# Patient Record
Sex: Female | Born: 1970 | Race: White | Hispanic: Yes | Marital: Single | Smoking: Never smoker
Health system: Southern US, Community
[De-identification: ages and names within clinical notes are randomized; demographics above are authoritative.]

## PROBLEM LIST (undated history)

## (undated) DIAGNOSIS — R87619 Unspecified abnormal cytological findings in specimens from cervix uteri: Secondary | ICD-10-CM

## (undated) HISTORY — DX: Unspecified abnormal cytological findings in specimens from cervix uteri: R87.619

---

## 1999-01-05 ENCOUNTER — Encounter: Admission: RE | Admit: 1999-01-05 | Discharge: 1999-04-05 | Payer: Self-pay

## 1999-11-01 ENCOUNTER — Other Ambulatory Visit: Admission: RE | Admit: 1999-11-01 | Discharge: 1999-11-01 | Payer: Self-pay | Admitting: Family Medicine

## 2000-05-02 ENCOUNTER — Inpatient Hospital Stay (HOSPITAL_COMMUNITY): Admission: AD | Admit: 2000-05-02 | Discharge: 2000-05-02 | Payer: Self-pay | Admitting: Family Medicine

## 2000-05-03 ENCOUNTER — Encounter: Payer: Self-pay | Admitting: Family Medicine

## 2000-05-23 ENCOUNTER — Encounter: Payer: Self-pay | Admitting: Family Medicine

## 2000-05-23 ENCOUNTER — Ambulatory Visit (HOSPITAL_COMMUNITY): Admission: RE | Admit: 2000-05-23 | Discharge: 2000-05-23 | Payer: Self-pay | Admitting: Family Medicine

## 2000-06-12 ENCOUNTER — Other Ambulatory Visit: Admission: RE | Admit: 2000-06-12 | Discharge: 2000-06-12 | Payer: Self-pay | Admitting: Family Medicine

## 2000-07-23 ENCOUNTER — Ambulatory Visit (HOSPITAL_COMMUNITY): Admission: RE | Admit: 2000-07-23 | Discharge: 2000-07-23 | Payer: Self-pay | Admitting: Family Medicine

## 2000-07-23 ENCOUNTER — Encounter: Payer: Self-pay | Admitting: Family Medicine

## 2000-09-28 ENCOUNTER — Encounter: Payer: Self-pay | Admitting: Family Medicine

## 2000-09-28 ENCOUNTER — Ambulatory Visit (HOSPITAL_COMMUNITY): Admission: RE | Admit: 2000-09-28 | Discharge: 2000-09-28 | Payer: Self-pay | Admitting: Family Medicine

## 2000-11-27 HISTORY — PX: CHOLECYSTECTOMY: SHX55

## 2000-12-03 ENCOUNTER — Ambulatory Visit (HOSPITAL_COMMUNITY): Admission: RE | Admit: 2000-12-03 | Discharge: 2000-12-03 | Payer: Self-pay | Admitting: *Deleted

## 2000-12-03 ENCOUNTER — Encounter: Payer: Self-pay | Admitting: Family Medicine

## 2000-12-10 ENCOUNTER — Inpatient Hospital Stay (HOSPITAL_COMMUNITY): Admission: AD | Admit: 2000-12-10 | Discharge: 2000-12-12 | Payer: Self-pay | Admitting: Family Medicine

## 2001-02-01 ENCOUNTER — Encounter (HOSPITAL_BASED_OUTPATIENT_CLINIC_OR_DEPARTMENT_OTHER): Payer: Self-pay | Admitting: General Surgery

## 2001-02-01 ENCOUNTER — Ambulatory Visit (HOSPITAL_COMMUNITY): Admission: RE | Admit: 2001-02-01 | Discharge: 2001-02-02 | Payer: Self-pay | Admitting: General Surgery

## 2005-03-24 ENCOUNTER — Ambulatory Visit: Payer: Self-pay | Admitting: Family Medicine

## 2006-01-22 ENCOUNTER — Ambulatory Visit: Payer: Self-pay | Admitting: Family Medicine

## 2006-02-26 ENCOUNTER — Ambulatory Visit: Payer: Self-pay | Admitting: Family Medicine

## 2006-12-21 ENCOUNTER — Ambulatory Visit: Payer: Self-pay | Admitting: Family Medicine

## 2009-11-27 DIAGNOSIS — R87619 Unspecified abnormal cytological findings in specimens from cervix uteri: Secondary | ICD-10-CM

## 2009-11-27 HISTORY — DX: Unspecified abnormal cytological findings in specimens from cervix uteri: R87.619

## 2009-11-27 HISTORY — PX: COLPOSCOPY W/ BIOPSY / CURETTAGE: SUR283

## 2012-02-13 ENCOUNTER — Other Ambulatory Visit (HOSPITAL_COMMUNITY)
Admission: RE | Admit: 2012-02-13 | Discharge: 2012-02-13 | Disposition: A | Discharge status: Home / Self Care | Payer: Self-pay | Source: Ambulatory Visit | Attending: Unknown Physician Specialty | Admitting: Unknown Physician Specialty

## 2012-02-13 DIAGNOSIS — R87619 Unspecified abnormal cytological findings in specimens from cervix uteri: Secondary | ICD-10-CM | POA: Insufficient documentation

## 2012-02-13 DIAGNOSIS — R87612 Low grade squamous intraepithelial lesion on cytologic smear of cervix (LGSIL): Secondary | ICD-10-CM | POA: Insufficient documentation

## 2013-09-22 ENCOUNTER — Other Ambulatory Visit (HOSPITAL_COMMUNITY): Payer: Self-pay | Admitting: Nurse Practitioner

## 2013-09-22 ENCOUNTER — Other Ambulatory Visit (HOSPITAL_COMMUNITY): Payer: Self-pay | Admitting: *Deleted

## 2013-09-22 DIAGNOSIS — Z1231 Encounter for screening mammogram for malignant neoplasm of breast: Secondary | ICD-10-CM

## 2013-10-02 ENCOUNTER — Ambulatory Visit (HOSPITAL_COMMUNITY)
Admission: RE | Admit: 2013-10-02 | Discharge: 2013-10-02 | Disposition: A | Discharge status: Home / Self Care | Payer: Managed Care, Other (non HMO) | Source: Ambulatory Visit | Attending: *Deleted | Admitting: *Deleted

## 2013-10-02 DIAGNOSIS — Z1231 Encounter for screening mammogram for malignant neoplasm of breast: Secondary | ICD-10-CM

## 2014-09-16 ENCOUNTER — Other Ambulatory Visit (HOSPITAL_COMMUNITY): Payer: Self-pay | Admitting: *Deleted

## 2014-09-16 DIAGNOSIS — Z1231 Encounter for screening mammogram for malignant neoplasm of breast: Secondary | ICD-10-CM

## 2014-10-05 ENCOUNTER — Ambulatory Visit (HOSPITAL_COMMUNITY)
Admission: RE | Admit: 2014-10-05 | Discharge: 2014-10-05 | Disposition: A | Discharge status: Home / Self Care | Payer: Managed Care, Other (non HMO) | Source: Ambulatory Visit | Attending: *Deleted | Admitting: *Deleted

## 2014-10-05 DIAGNOSIS — Z1231 Encounter for screening mammogram for malignant neoplasm of breast: Secondary | ICD-10-CM

## 2014-11-23 ENCOUNTER — Ambulatory Visit (INDEPENDENT_AMBULATORY_CARE_PROVIDER_SITE_OTHER): Payer: Managed Care, Other (non HMO) | Admitting: Obstetrics & Gynecology

## 2014-11-23 ENCOUNTER — Encounter: Payer: Self-pay | Admitting: Obstetrics & Gynecology

## 2014-11-23 VITALS — BP 93/64 | HR 67 | Resp 16 | Ht 61.0 in | Wt 142.0 lb

## 2014-11-23 DIAGNOSIS — Z30011 Encounter for initial prescription of contraceptive pills: Secondary | ICD-10-CM

## 2014-11-23 DIAGNOSIS — Z3009 Encounter for other general counseling and advice on contraception: Secondary | ICD-10-CM

## 2014-11-23 MED ORDER — NORELGESTROMIN-ETH ESTRADIOL 150-35 MCG/24HR TD PTWK: 1.0000 | MEDICATED_PATCH | TRANSDERMAL | Status: AC

## 2014-11-23 NOTE — Progress Notes (Signed)
Patient ID: Anna Henderson, female   DOB: [birth year], 43 y.o.   MRN: [id number]  Chief Complaint  Patient presents with  . Contraception    Last PAP this month - result normal    HPI Anna Henderson is a 43 y.o. female.  [id number] Patient's last menstrual period was 11/23/2014 (exact date). Was prescribed Tri Sprintec but considering alternative for ease of compliance. Was satisfied with [facility] years ago  HPI  Past Medical History  Diagnosis Date  . Abnormal Pap smear of cervix 2011    Past Surgical History  Procedure Laterality Date  . Colposcopy w/ biopsy / curettage  2011    Normal result  . Cesarean section  1994 and 2002  . Cholecystectomy  2002    History reviewed. No pertinent family history.  Social History History  Substance Use Topics  . Smoking status: Never Smoker   . Smokeless tobacco: Never Used  . Alcohol Use: No    Allergies  Allergen Reactions  . Penicillins Swelling    Current Outpatient Prescriptions  Medication Sig Dispense Refill  . Norgestimate-Ethinyl Estradiol Triphasic (TRI-SPRINTEC) 0.18/0.215/0.25 MG-35 MCG tablet Take 1 tablet by mouth daily.    . norelgestromin-ethinyl estradiol (ORTHO EVRA) 150-35 MCG/24HR transdermal patch Place 1 patch onto the skin once a week. 3 patch 12   No current facility-administered medications for this visit.    Review of Systems Review of Systems  Constitutional: Negative.   Respiratory: Negative.   Genitourinary: Positive for vaginal discharge. Negative for vaginal bleeding.    Blood pressure 93/64, pulse 67, resp. rate 16, height 5\' 1"  (1.549 m), weight 64.411 kg (142 lb), last menstrual period 11/23/2014.  Physical Exam Physical Exam  Constitutional: She is oriented to person, place, and time. She appears well-developed and well-nourished.  Neurological: She is alert and oriented to person, place, and time.  Psychiatric: She has a normal mood and affect. Her behavior is normal.    Data  Reviewed   Assessment    Birth control counseling     Plan    Rx [facility] given and instructions        Haasini Patnaude 11/23/2014, 10:16 AM

## 2014-11-23 NOTE — Patient Instructions (Signed)
Etonogestrel implant What is this medicine? ETONOGESTREL (et oh noe JES trel) is a contraceptive (birth control) device. It is used to prevent pregnancy. It can be used for up to 3 years. This medicine may be used for other purposes; ask your health care provider or pharmacist if you have questions. COMMON BRAND NAME(S): Implanon, Nexplanon What should I tell my health care provider before I take this medicine? They need to know if you have any of these conditions: -abnormal vaginal bleeding -blood vessel disease or blood clots -cancer of the breast, cervix, or liver -depression -diabetes -gallbladder disease -headaches -heart disease or recent heart attack -high blood pressure -high cholesterol -kidney disease -liver disease -renal disease -seizures -tobacco smoker -an unusual or allergic reaction to etonogestrel, other hormones, anesthetics or antiseptics, medicines, foods, dyes, or preservatives -pregnant or trying to get pregnant -breast-feeding How should I use this medicine? This device is inserted just under the skin on the inner side of your upper arm by a health care professional. Talk to your pediatrician regarding the use of this medicine in children. Special care may be needed. Overdosage: If you think you've taken too much of this medicine contact a poison control center or emergency room at once. Overdosage: If you think you have taken too much of this medicine contact a poison control center or emergency room at once. NOTE: This medicine is only for you. Do not share this medicine with others. What if I miss a dose? This does not apply. What may interact with this medicine? Do not take this medicine with any of the following medications: -amprenavir -bosentan -fosamprenavir This medicine may also interact with the following medications: -barbiturate medicines for inducing sleep or treating seizures -certain medicines for fungal infections like ketoconazole and  itraconazole -griseofulvin -medicines to treat seizures like carbamazepine, felbamate, oxcarbazepine, phenytoin, topiramate -modafinil -phenylbutazone -rifampin -some medicines to treat HIV infection like atazanavir, indinavir, lopinavir, nelfinavir, tipranavir, ritonavir -St. John's wort This list may not describe all possible interactions. Give your health care provider a list of all the medicines, herbs, non-prescription drugs, or dietary supplements you use. Also tell them if you smoke, drink alcohol, or use illegal drugs. Some items may interact with your medicine. What should I watch for while using this medicine? This product does not protect you against HIV infection (AIDS) or other sexually transmitted diseases. You should be able to feel the implant by pressing your fingertips over the skin where it was inserted. Tell your doctor if you cannot feel the implant. What side effects may I notice from receiving this medicine? Side effects that you should report to your doctor or health care professional as soon as possible: -allergic reactions like skin rash, itching or hives, swelling of the face, lips, or tongue -breast lumps -changes in vision -confusion, trouble speaking or understanding -dark urine -depressed mood -general ill feeling or flu-like symptoms -light-colored stools -loss of appetite, nausea -right upper belly pain -severe headaches -severe pain, swelling, or tenderness in the abdomen -shortness of breath, chest pain, swelling in a leg -signs of pregnancy -sudden numbness or weakness of the face, arm or leg -trouble walking, dizziness, loss of balance or coordination -unusual vaginal bleeding, discharge -unusually weak or tired -yellowing of the eyes or skin Side effects that usually do not require medical attention (Report these to your doctor or health care professional if they continue or are bothersome.): -acne -breast pain -changes in  weight -cough -fever or chills -headache -irregular menstrual bleeding -itching, burning, and   vaginal discharge -pain or difficulty passing urine -sore throat This list may not describe all possible side effects. Call your doctor for medical advice about side effects. You may report side effects to FDA at 1-800-FDA-1088. Where should I keep my medicine? This drug is given in a hospital or clinic and will not be stored at home. NOTE: This sheet is a summary. It may not cover all possible information. If you have questions about this medicine, talk to your doctor, pharmacist, or health care provider.  2015, Elsevier/Gold Standard. (2012-05-20 15:37:45) Intrauterine Device Information An intrauterine device (IUD) is inserted into your uterus to prevent pregnancy. There are two types of IUDs available:   Copper IUD--This type of IUD is wrapped in copper wire and is placed inside the uterus. Copper makes the uterus and fallopian tubes produce a fluid that kills sperm. The copper IUD can stay in place for 10 years.  Hormone IUD--This type of IUD contains the hormone progestin (synthetic progesterone). The hormone thickens the cervical mucus and prevents sperm from entering the uterus. It also thins the uterine lining to prevent implantation of a fertilized egg. The hormone can weaken or kill the sperm that get into the uterus. One type of hormone IUD can stay in place for 5 years, and another type can stay in place for 3 years. Your health care provider will make sure you are a good candidate for a contraceptive IUD. Discuss with your health care provider the possible side effects.  ADVANTAGES OF AN INTRAUTERINE DEVICE  IUDs are highly effective, reversible, long acting, and low maintenance.   There are no estrogen-related side effects.   An IUD can be used when breastfeeding.   IUDs are not associated with weight gain.   The copper IUD works immediately after insertion.   The hormone  IUD works right away if inserted within 7 days of your period starting. You will need to use a backup method of birth control for 7 days if the hormone IUD is inserted at any other time in your cycle.  The copper IUD does not interfere with your female hormones.   The hormone IUD can make heavy menstrual periods lighter and decrease cramping.   The hormone IUD can be used for 3 or 5 years.   The copper IUD can be used for 10 years. DISADVANTAGES OF AN INTRAUTERINE DEVICE  The hormone IUD can be associated with irregular bleeding patterns.   The copper IUD can make your menstrual flow heavier and more painful.   You may experience cramping and vaginal bleeding after insertion.  Document Released: 10/17/2004 Document Revised: 07/16/2013 Document Reviewed: 05/04/2013 [facility]Exit[facility] Patient Information 2015 [place], [place]. This information is not intended to replace advice given to you by your health care provider. Make sure you discuss any questions you have with your health care provider. Contraception Choices Contraception (birth control) is the use of any methods or devices to prevent pregnancy. Below are some methods to help avoid pregnancy. HORMONAL METHODS   Contraceptive implant. This is a thin, plastic tube containing progesterone hormone. It does not contain estrogen hormone. Your health care provider inserts the tube in the inner part of the upper arm. The tube can remain in place for up to 3 years. After 3 years, the implant must be removed. The implant prevents the ovaries from releasing an egg (ovulation), thickens the cervical mucus to prevent sperm from entering the uterus, and thins the lining of the inside of the uterus.  Progesterone-only injections.  These injections are given every 3 months by your health care provider to prevent pregnancy. This synthetic progesterone hormone stops the ovaries from releasing eggs. It also thickens cervical mucus and changes the uterine  lining. This makes it harder for sperm to survive in the uterus.  Birth control pills. These pills contain estrogen and progesterone hormone. They work by preventing the ovaries from releasing eggs (ovulation). They also cause the cervical mucus to thicken, preventing the sperm from entering the uterus. Birth control pills are prescribed by a health care provider.Birth control pills can also be used to treat heavy periods.  Minipill. This type of birth control pill contains only the progesterone hormone. They are taken every day of each month and must be prescribed by your health care provider.  Birth control patch. The patch contains hormones similar to those in birth control pills. It must be changed once a week and is prescribed by a health care provider.  Vaginal ring. The ring contains hormones similar to those in birth control pills. It is left in the vagina for 3 weeks, removed for 1 week, and then a new one is put back in place. The patient must be comfortable inserting and removing the ring from the vagina.A health care provider's prescription is necessary.  Emergency contraception. Emergency contraceptives prevent pregnancy after unprotected sexual intercourse. This pill can be taken right after sex or up to 5 days after unprotected sex. It is most effective the sooner you take the pills after having sexual intercourse. Most emergency contraceptive pills are available without a prescription. Check with your pharmacist. Do not use emergency contraception as your only form of birth control. BARRIER METHODS   Female condom. This is a thin sheath (latex or rubber) that is worn over the penis during sexual intercourse. It can be used with spermicide to increase effectiveness.  Female condom. This is a soft, loose-fitting sheath that is put into the vagina before sexual intercourse.  Diaphragm. This is a soft, latex, dome-shaped barrier that must be fitted by a health care provider. It is  inserted into the vagina, along with a spermicidal jelly. It is inserted before intercourse. The diaphragm should be left in the vagina for 6 to 8 hours after intercourse.  Cervical cap. This is a round, soft, latex or plastic cup that fits over the cervix and must be fitted by a health care provider. The cap can be left in place for up to 48 hours after intercourse.  Sponge. This is a soft, circular piece of polyurethane foam. The sponge has spermicide in it. It is inserted into the vagina after wetting it and before sexual intercourse.  Spermicides. These are chemicals that kill or block sperm from entering the cervix and uterus. They come in the form of creams, jellies, suppositories, foam, or tablets. They do not require a prescription. They are inserted into the vagina with an applicator before having sexual intercourse. The process must be repeated every time you have sexual intercourse. INTRAUTERINE CONTRACEPTION  Intrauterine device (IUD). This is a T-shaped device that is put in a woman's uterus during a menstrual period to prevent pregnancy. There are 2 types:  Copper IUD. This type of IUD is wrapped in copper wire and is placed inside the uterus. Copper makes the uterus and fallopian tubes produce a fluid that kills sperm. It can stay in place for 10 years.  Hormone IUD. This type of IUD contains the hormone progestin (synthetic progesterone). The hormone thickens the cervical mucus  and prevents sperm from entering the uterus, and it also thins the uterine lining to prevent implantation of a fertilized egg. The hormone can weaken or kill the sperm that get into the uterus. It can stay in place for 3-5 years, depending on which type of IUD is used. PERMANENT METHODS OF CONTRACEPTION  Female tubal ligation. This is when the woman's fallopian tubes are surgically sealed, tied, or blocked to prevent the egg from traveling to the uterus.  Hysteroscopic sterilization. This involves placing a  small coil or insert into each fallopian tube. Your doctor uses a technique called hysteroscopy to do the procedure. The device causes scar tissue to form. This results in permanent blockage of the fallopian tubes, so the sperm cannot fertilize the egg. It takes about 3 months after the procedure for the tubes to become blocked. You must use another form of birth control for these 3 months.  Female sterilization. This is when the female has the tubes that carry sperm tied off (vasectomy).This blocks sperm from entering the vagina during sexual intercourse. After the procedure, the man can still ejaculate fluid (semen). NATURAL PLANNING METHODS  Natural family planning. This is not having sexual intercourse or using a barrier method (condom, diaphragm, cervical cap) on days the woman could become pregnant.  Calendar method. This is keeping track of the length of each menstrual cycle and identifying when you are fertile.  Ovulation method. This is avoiding sexual intercourse during ovulation.  Symptothermal method. This is avoiding sexual intercourse during ovulation, using a thermometer and ovulation symptoms.  Post-ovulation method. This is timing sexual intercourse after you have ovulated. Regardless of which type or method of contraception you choose, it is important that you use condoms to protect against the transmission of sexually transmitted infections (STIs). Talk with your health care provider about which form of contraception is most appropriate for you. Document Released: 11/13/2005 Document Revised: 11/18/2013 Document Reviewed: 05/08/2013 [facility] Patient Information 2015 [place]ExitCare, [place]. This information is not intended to replace advice given to you by your health care provider. Make sure you discuss any questions you have with your health care provider.

## 2016-01-19 IMAGING — MG MS DIGITAL SCREENING BILAT
6 series · 6 of 6 positions shown · non-contrast
Comparison: Previous exam(s).

CLINICAL DATA: Screening.

EXAM:
DIGITAL SCREENING BILATERAL MAMMOGRAM WITH CAD

[R CC]
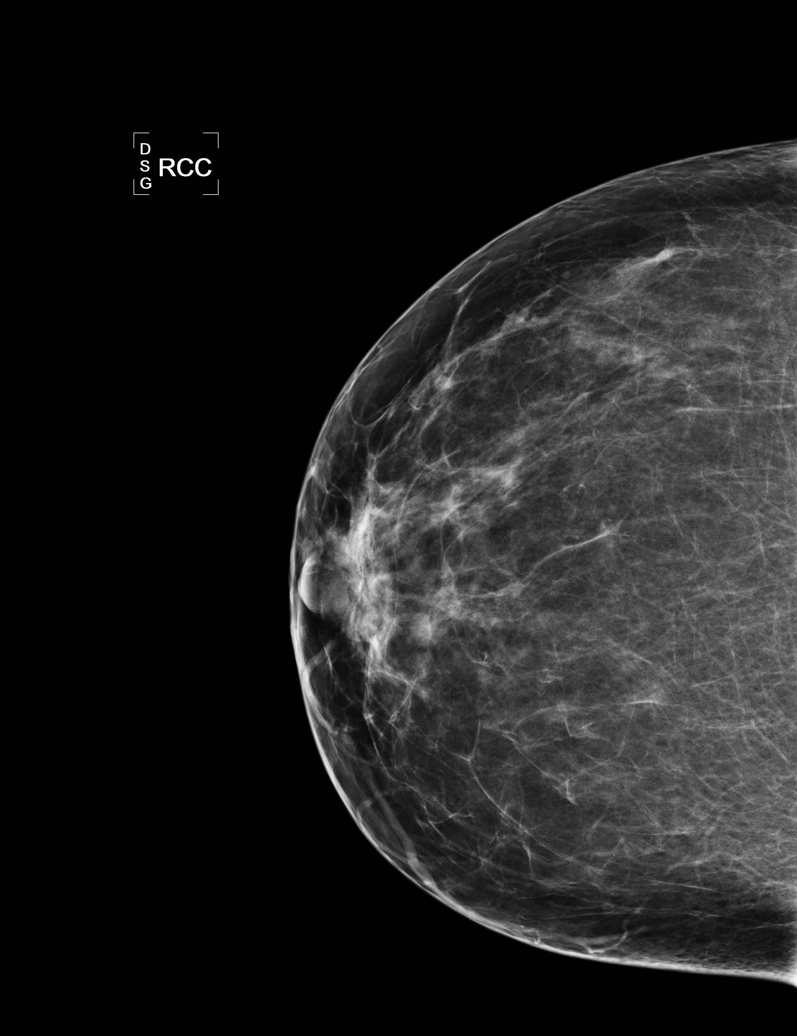

[R MLO (1 of 2)]
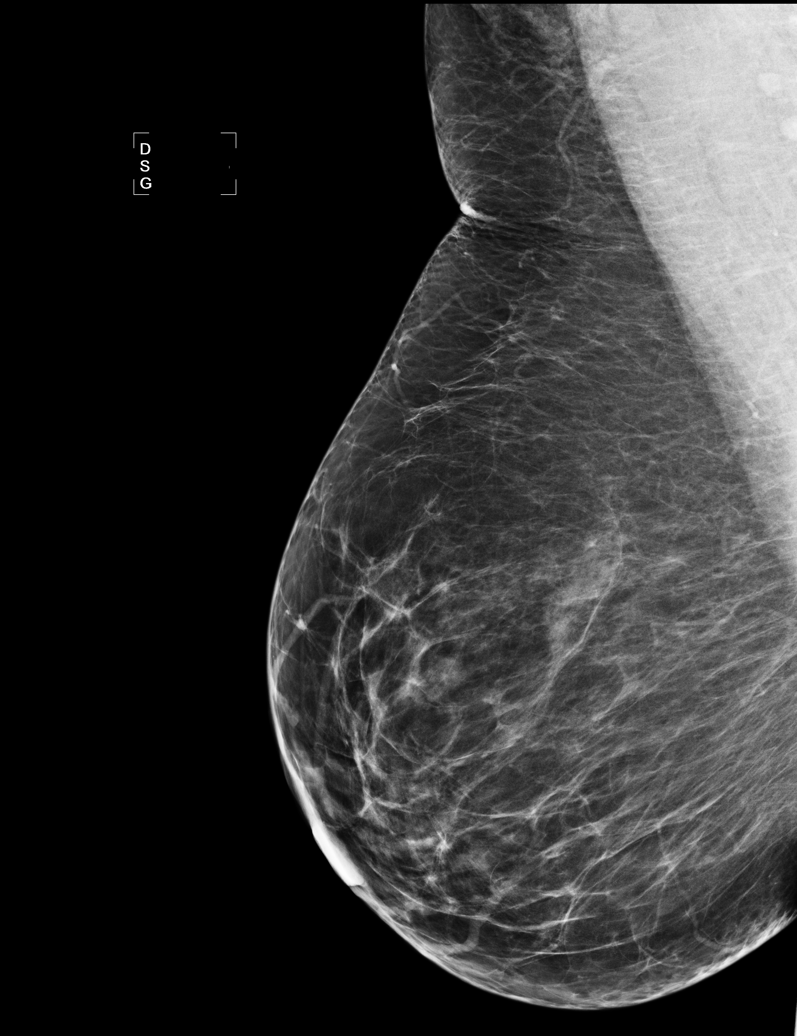

[L CC]
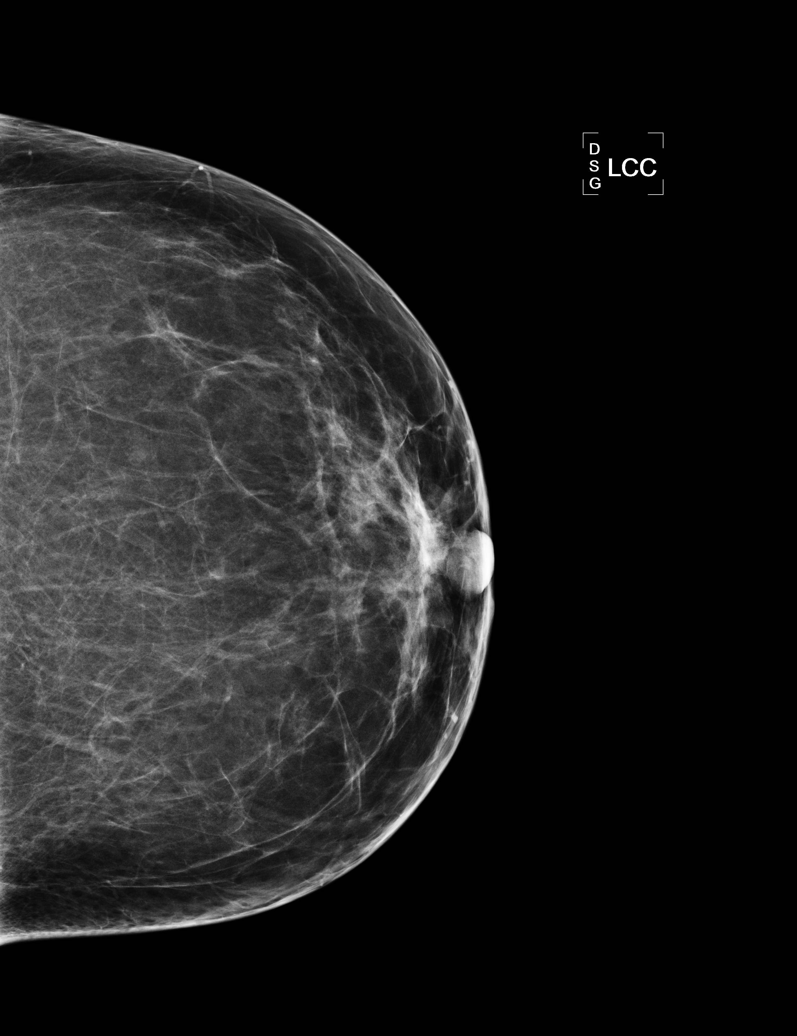

[L MLO (1 of 2)]
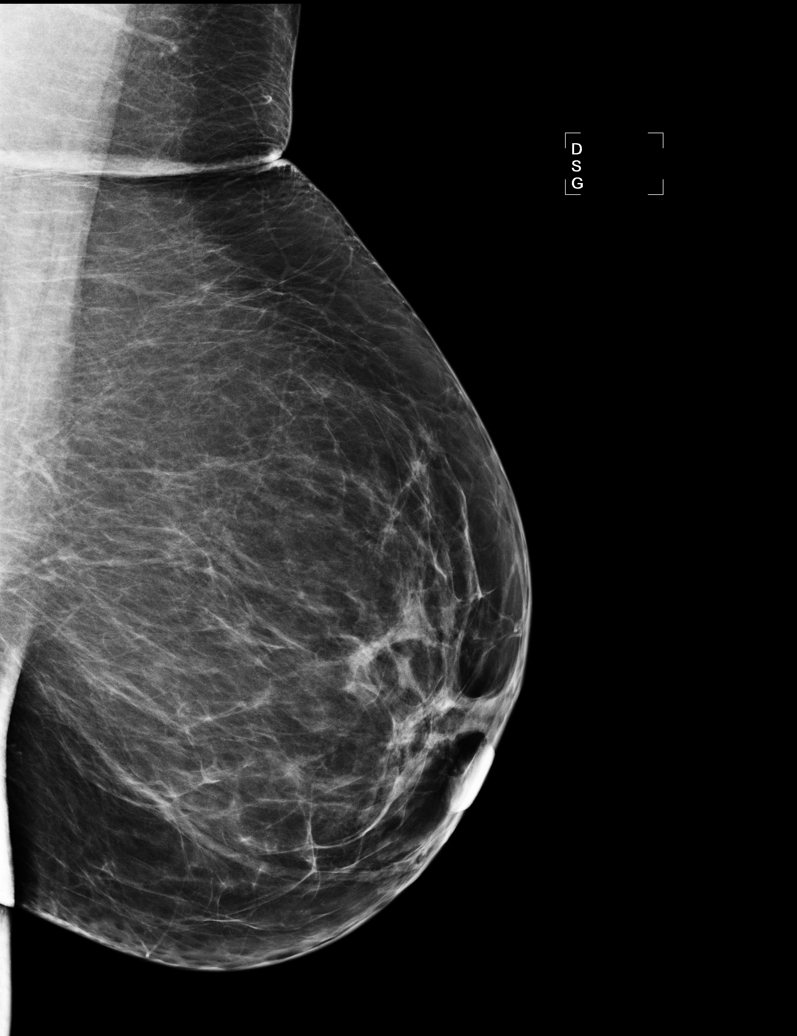

[L MLO (2 of 2)]
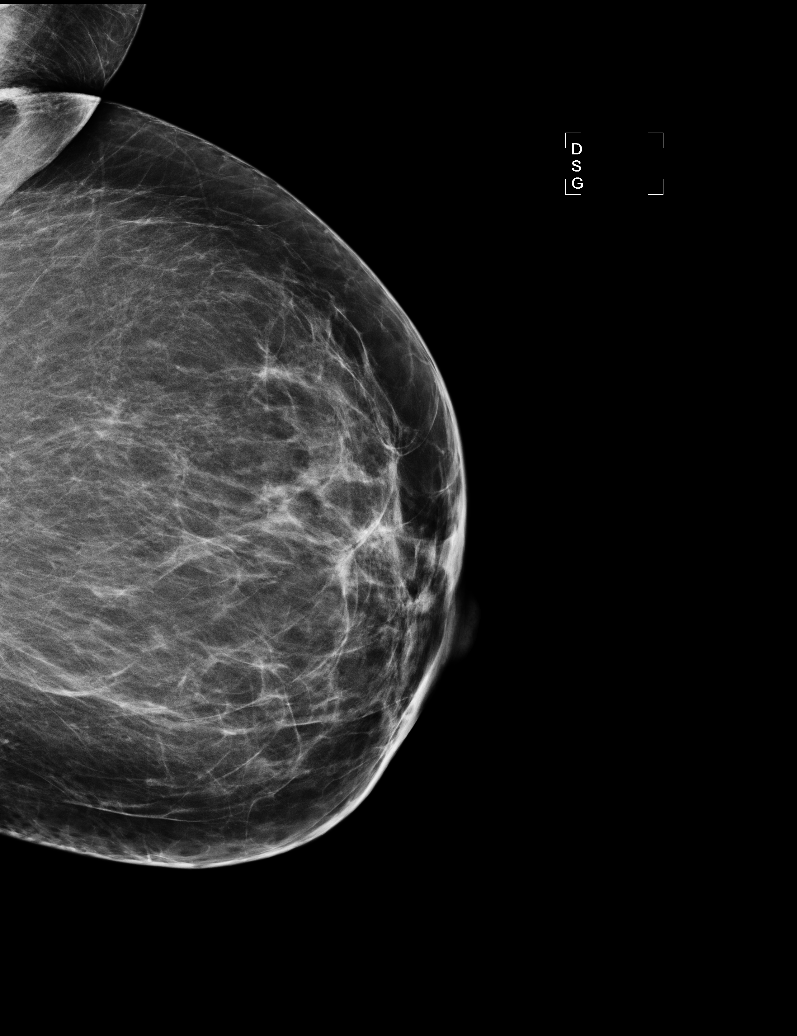

[R MLO (2 of 2)]
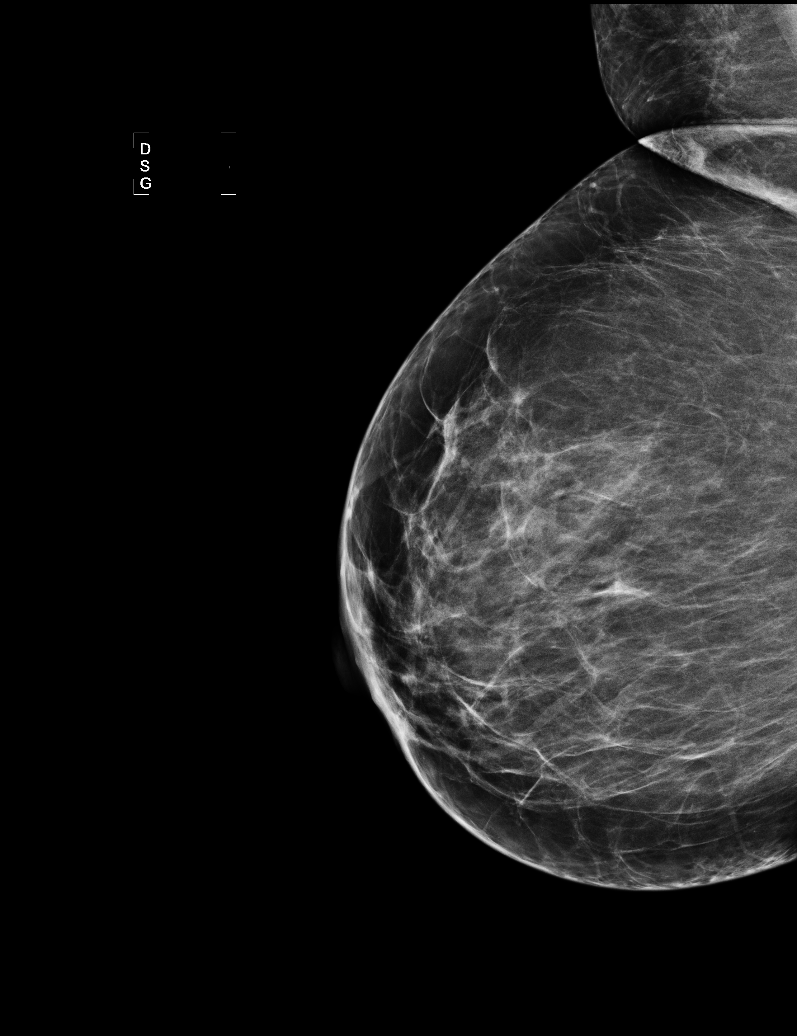

[6 of 6 positions shown; findings below may reference images not displayed]

ACR Breast Density Category b: There are scattered areas of
fibroglandular density.
FINDINGS: There are no findings suspicious for malignancy. Images were
processed with CAD.
IMPRESSION: No mammographic evidence of malignancy. A result letter of this
screening mammogram will be mailed directly to the patient.

RECOMMENDATION:
Screening mammogram in one year. (Code:AS-G-LCT)

BI-RADS CATEGORY  1: Negative.

## 2022-03-13 ENCOUNTER — Encounter (INDEPENDENT_AMBULATORY_CARE_PROVIDER_SITE_OTHER): Payer: Self-pay | Admitting: *Deleted

## 2022-08-29 ENCOUNTER — Encounter (INDEPENDENT_AMBULATORY_CARE_PROVIDER_SITE_OTHER): Payer: Self-pay | Admitting: *Deleted

## 2023-04-26 ENCOUNTER — Encounter: Payer: Self-pay | Admitting: *Deleted

## 2024-08-26 ENCOUNTER — Encounter (INDEPENDENT_AMBULATORY_CARE_PROVIDER_SITE_OTHER): Payer: Self-pay | Admitting: *Deleted
